# Patient Record
Sex: Male | Born: 1995 | Race: White | Hispanic: No | Marital: Single | Smoking: Current every day smoker
Health system: Southern US, Community
[De-identification: ages and names within clinical notes are randomized; demographics above are authoritative.]

## PROBLEM LIST (undated history)

## (undated) DIAGNOSIS — E119 Type 2 diabetes mellitus without complications: Secondary | ICD-10-CM

## (undated) DIAGNOSIS — I82409 Acute embolism and thrombosis of unspecified deep veins of unspecified lower extremity: Secondary | ICD-10-CM

---

## 2016-08-04 ENCOUNTER — Encounter: Payer: Self-pay | Admitting: Emergency Medicine

## 2016-08-04 ENCOUNTER — Inpatient Hospital Stay: Payer: BLUE CROSS/BLUE SHIELD

## 2016-08-04 ENCOUNTER — Emergency Department: Payer: BLUE CROSS/BLUE SHIELD

## 2016-08-04 ENCOUNTER — Inpatient Hospital Stay
Admission: EM | Admit: 2016-08-04 | Discharge: 2016-08-06 | DRG: 637 | Disposition: A | Discharge status: Home / Self Care | Payer: BLUE CROSS/BLUE SHIELD | Attending: Internal Medicine | Admitting: Internal Medicine

## 2016-08-04 DIAGNOSIS — N179 Acute kidney failure, unspecified: Secondary | ICD-10-CM | POA: Diagnosis present

## 2016-08-04 DIAGNOSIS — Z01818 Encounter for other preprocedural examination: Secondary | ICD-10-CM

## 2016-08-04 DIAGNOSIS — G9341 Metabolic encephalopathy: Secondary | ICD-10-CM | POA: Diagnosis present

## 2016-08-04 DIAGNOSIS — E1110 Type 2 diabetes mellitus with ketoacidosis without coma: Secondary | ICD-10-CM | POA: Diagnosis present

## 2016-08-04 DIAGNOSIS — Z9114 Patient's other noncompliance with medication regimen: Secondary | ICD-10-CM

## 2016-08-04 DIAGNOSIS — Z794 Long term (current) use of insulin: Secondary | ICD-10-CM

## 2016-08-04 DIAGNOSIS — E860 Dehydration: Secondary | ICD-10-CM | POA: Diagnosis present

## 2016-08-04 DIAGNOSIS — Z86718 Personal history of other venous thrombosis and embolism: Secondary | ICD-10-CM

## 2016-08-04 DIAGNOSIS — G9340 Encephalopathy, unspecified: Secondary | ICD-10-CM

## 2016-08-04 DIAGNOSIS — E871 Hypo-osmolality and hyponatremia: Secondary | ICD-10-CM | POA: Diagnosis present

## 2016-08-04 DIAGNOSIS — J9601 Acute respiratory failure with hypoxia: Secondary | ICD-10-CM | POA: Diagnosis not present

## 2016-08-04 DIAGNOSIS — E1010 Type 1 diabetes mellitus with ketoacidosis without coma: Principal | ICD-10-CM | POA: Diagnosis present

## 2016-08-04 DIAGNOSIS — F17210 Nicotine dependence, cigarettes, uncomplicated: Secondary | ICD-10-CM | POA: Diagnosis present

## 2016-08-04 DIAGNOSIS — E876 Hypokalemia: Secondary | ICD-10-CM | POA: Diagnosis present

## 2016-08-04 HISTORY — DX: Acute embolism and thrombosis of unspecified deep veins of unspecified lower extremity: I82.409

## 2016-08-04 HISTORY — DX: Type 2 diabetes mellitus without complications: E11.9

## 2016-08-04 LAB — URINALYSIS, COMPLETE (UACMP) WITH MICROSCOPIC
BACTERIA UA: NONE SEEN
BILIRUBIN URINE: NEGATIVE
HGB URINE DIPSTICK: NEGATIVE
Ketones, ur: 80 mg/dL — AB
LEUKOCYTES UA: NEGATIVE
NITRITE: NEGATIVE
PH: 5 (ref 5.0–8.0)
Protein, ur: 30 mg/dL — AB
RBC / HPF: NONE SEEN RBC/hpf (ref 0–5)
SPECIFIC GRAVITY, URINE: 1.015 (ref 1.005–1.030)
Squamous Epithelial / LPF: NONE SEEN
WBC, UA: NONE SEEN WBC/hpf (ref 0–5)

## 2016-08-04 LAB — LACTIC ACID, PLASMA: Lactic Acid, Venous: 3.1 mmol/L (ref 0.5–1.9)

## 2016-08-04 LAB — BASIC METABOLIC PANEL
ANION GAP: 21 — AB (ref 5–15)
ANION GAP: 15 (ref 5–15)
BUN: 16 mg/dL (ref 6–20)
BUN: 12 mg/dL (ref 6–20)
CALCIUM: 10.1 mg/dL (ref 8.9–10.3)
CHLORIDE: 101 mmol/L (ref 101–111)
CO2: 16 mmol/L — AB (ref 22–32)
CO2: 13 mmol/L — ABNORMAL LOW (ref 22–32)
Calcium: 8.4 mg/dL — ABNORMAL LOW (ref 8.9–10.3)
Chloride: 90 mmol/L — ABNORMAL LOW (ref 101–111)
Creatinine, Ser: 1.3 mg/dL — ABNORMAL HIGH (ref 0.61–1.24)
Creatinine, Ser: 1.02 mg/dL (ref 0.61–1.24)
GFR calc Af Amer: >60 mL/min (ref >60)
GFR calc Af Amer: >60 mL/min (ref >60)
GFR calc non Af Amer: >60 mL/min (ref >60)
GLUCOSE: 255 mg/dL — AB (ref 65–99)
Glucose, Bld: 347 mg/dL — ABNORMAL HIGH (ref 65–99)
POTASSIUM: 2.9 mmol/L — AB (ref 3.5–5.1)
Potassium: 4 mmol/L (ref 3.5–5.1)
Sodium: 127 mmol/L — ABNORMAL LOW (ref 135–145)
Sodium: 129 mmol/L — ABNORMAL LOW (ref 135–145)

## 2016-08-04 LAB — URINE DRUG SCREEN, QUALITATIVE (ARMC ONLY)
AMPHETAMINES, UR SCREEN: NOT DETECTED
Barbiturates, Ur Screen: NOT DETECTED
Benzodiazepine, Ur Scrn: NOT DETECTED
CANNABINOID 50 NG, UR ~~LOC~~: NOT DETECTED
COCAINE METABOLITE, UR ~~LOC~~: NOT DETECTED
MDMA (ECSTASY) UR SCREEN: NOT DETECTED
Methadone Scn, Ur: NOT DETECTED
Opiate, Ur Screen: NOT DETECTED
Phencyclidine (PCP) Ur S: NOT DETECTED
TRICYCLIC, UR SCREEN: NOT DETECTED

## 2016-08-04 LAB — CBC WITH DIFFERENTIAL/PLATELET
BASOS ABS: 0.1 10*3/uL (ref 0–0.1)
Basophils Relative: 1 %
Eosinophils Absolute: 0 10*3/uL (ref 0–0.7)
Eosinophils Relative: 0 %
HEMATOCRIT: 50.2 % (ref 40.0–52.0)
Hemoglobin: 17.2 g/dL (ref 13.0–18.0)
LYMPHS ABS: 1.4 10*3/uL (ref 1.0–3.6)
LYMPHS PCT: 13 %
MCH: 29.9 pg (ref 26.0–34.0)
MCHC: 34.3 g/dL (ref 32.0–36.0)
MCV: 87 fL (ref 80.0–100.0)
MONO ABS: 1.2 10*3/uL — AB (ref 0.2–1.0)
MONOS PCT: 11 %
NEUTROS ABS: 7.9 10*3/uL — AB (ref 1.4–6.5)
Neutrophils Relative %: 75 %
Platelets: 418 10*3/uL (ref 150–440)
RBC: 5.77 MIL/uL (ref 4.40–5.90)
RDW: 13.9 % (ref 11.5–14.5)
WBC: 10.6 10*3/uL (ref 3.8–10.6)

## 2016-08-04 LAB — BLOOD GAS, ARTERIAL
Acid-base deficit: 17 mmol/L — ABNORMAL HIGH (ref 0.0–2.0)
BICARBONATE: 11.4 mmol/L — AB (ref 20.0–28.0)
FIO2: 1
LHR: 26 {breaths}/min
MECHVT: 500 mL
PATIENT TEMPERATURE: 37
PEEP/CPAP: 8 cmH2O
PH ART: 7.12 — AB (ref 7.350–7.450)
pCO2 arterial: 35 mmHg (ref 32.0–48.0)
pO2, Arterial: >552 mmHg — ABNORMAL HIGH (ref 83.0–108.0)

## 2016-08-04 LAB — GLUCOSE, CAPILLARY
GLUCOSE-CAPILLARY: 320 mg/dL — AB (ref 65–99)
GLUCOSE-CAPILLARY: 350 mg/dL — AB (ref 65–99)
Glucose-Capillary: 357 mg/dL — ABNORMAL HIGH (ref 65–99)
Glucose-Capillary: 280 mg/dL — ABNORMAL HIGH (ref 65–99)
Glucose-Capillary: 156 mg/dL — ABNORMAL HIGH (ref 65–99)
Glucose-Capillary: 182 mg/dL — ABNORMAL HIGH (ref 65–99)
Glucose-Capillary: 189 mg/dL — ABNORMAL HIGH (ref 65–99)

## 2016-08-04 LAB — TRIGLYCERIDES: TRIGLYCERIDES: 318 mg/dL — AB (ref <150)

## 2016-08-04 LAB — AMMONIA: Ammonia: 36 umol/L — ABNORMAL HIGH (ref 9–35)

## 2016-08-04 LAB — MAGNESIUM: Magnesium: 1.6 mg/dL — ABNORMAL LOW (ref 1.7–2.4)

## 2016-08-04 LAB — SALICYLATE LEVEL: Salicylate Lvl: <7 mg/dL (ref 2.8–30.0)

## 2016-08-04 LAB — MRSA PCR SCREENING: MRSA by PCR: NEGATIVE

## 2016-08-04 LAB — TROPONIN I: Troponin I: <0.03 ng/mL (ref <0.03)

## 2016-08-04 LAB — ACETAMINOPHEN LEVEL: Acetaminophen (Tylenol), Serum: <10 ug/mL — ABNORMAL LOW (ref 10–30)

## 2016-08-04 LAB — BETA-HYDROXYBUTYRIC ACID: BETA-HYDROXYBUTYRIC ACID: 5.76 mmol/L — AB (ref 0.05–0.27)

## 2016-08-04 LAB — PHOSPHORUS: Phosphorus: 3.5 mg/dL (ref 2.5–4.6)

## 2016-08-04 MED ORDER — ACETAMINOPHEN 650 MG RE SUPP: 650.0000 mg | Freq: Four times a day (QID) | RECTAL | Status: DC | PRN

## 2016-08-04 MED ORDER — SODIUM CHLORIDE 0.9 % IV SOLN
30.0000 meq | Freq: Once | INTRAVENOUS | Status: AC
  Administered 2016-08-04: 30 meq via INTRAVENOUS
  Filled 2016-08-04: qty 15

## 2016-08-04 MED ORDER — SODIUM CHLORIDE 0.9 % IV BOLUS (SEPSIS)
1000.0000 mL | Freq: Once | INTRAVENOUS | Status: AC
Start: 2016-08-04 — End: 2016-08-04
  Administered 2016-08-04: 1000 mL via INTRAVENOUS

## 2016-08-04 MED ORDER — SODIUM CHLORIDE 0.9 % IV SOLN: INTRAVENOUS | Status: AC

## 2016-08-04 MED ORDER — FAMOTIDINE IN NACL 20-0.9 MG/50ML-% IV SOLN: 20.0000 mg | INTRAVENOUS | Status: DC

## 2016-08-04 MED ORDER — ONDANSETRON HCL 4 MG PO TABS: 4.0000 mg | ORAL_TABLET | Freq: Four times a day (QID) | ORAL | Status: DC | PRN

## 2016-08-04 MED ORDER — METOPROLOL TARTRATE 5 MG/5ML IV SOLN
INTRAVENOUS | Status: AC
  Administered 2016-08-04: 5 mg via INTRAVENOUS
  Filled 2016-08-04: qty 5

## 2016-08-04 MED ORDER — SODIUM CHLORIDE 0.9 % IV SOLN
INTRAVENOUS | Status: DC
  Administered 2016-08-04: 18:00:00 via INTRAVENOUS

## 2016-08-04 MED ORDER — KETOROLAC TROMETHAMINE 30 MG/ML IJ SOLN: 30.0000 mg | Freq: Four times a day (QID) | INTRAMUSCULAR | Status: DC | PRN

## 2016-08-04 MED ORDER — SODIUM CHLORIDE 0.9 % IV SOLN
INTRAVENOUS | Status: DC
  Administered 2016-08-04: 2.9 [IU]/h via INTRAVENOUS
  Filled 2016-08-04: qty 2.5

## 2016-08-04 MED ORDER — LIDOCAINE VISCOUS 2 % MT SOLN
15.0000 mL | Freq: Once | OROMUCOSAL | Status: AC
  Administered 2016-08-04: 15 mL via OROMUCOSAL
  Filled 2016-08-04: qty 15

## 2016-08-04 MED ORDER — FENTANYL CITRATE (PF) 100 MCG/2ML IJ SOLN
100.0000 ug | INTRAMUSCULAR | Status: DC | PRN
  Filled 2016-08-04: qty 2

## 2016-08-04 MED ORDER — PRO-STAT SUGAR FREE PO LIQD: 30.0000 mL | Freq: Two times a day (BID) | ORAL | Status: DC

## 2016-08-04 MED ORDER — HEPARIN SODIUM (PORCINE) 5000 UNIT/ML IJ SOLN
5000.0000 [IU] | Freq: Three times a day (TID) | INTRAMUSCULAR | Status: DC
  Administered 2016-08-04: 5000 [IU] via SUBCUTANEOUS
  Filled 2016-08-04: qty 1

## 2016-08-04 MED ORDER — FENTANYL CITRATE (PF) 100 MCG/2ML IJ SOLN
100.0000 ug | Freq: Once | INTRAMUSCULAR | Status: AC
  Administered 2016-08-04: 100 ug via INTRAVENOUS

## 2016-08-04 MED ORDER — METOPROLOL TARTRATE 5 MG/5ML IV SOLN
5.0000 mg | INTRAVENOUS | Status: AC
  Administered 2016-08-04: 5 mg via INTRAVENOUS

## 2016-08-04 MED ORDER — HYDROCODONE-ACETAMINOPHEN 5-325 MG PO TABS: 1.0000 | ORAL_TABLET | ORAL | Status: DC | PRN

## 2016-08-04 MED ORDER — VITAL HIGH PROTEIN PO LIQD: 1000.0000 mL | ORAL | Status: DC

## 2016-08-04 MED ORDER — ROCURONIUM BROMIDE 50 MG/5ML IV SOLN
50.0000 mg | Freq: Once | INTRAVENOUS | Status: AC
Start: 2016-08-04 — End: 2016-08-04
  Administered 2016-08-04: 50 mg via INTRAVENOUS

## 2016-08-04 MED ORDER — FENTANYL CITRATE (PF) 100 MCG/2ML IJ SOLN
100.0000 ug | INTRAMUSCULAR | Status: DC | PRN
  Administered 2016-08-04: 100 ug via INTRAVENOUS

## 2016-08-04 MED ORDER — DEXTROSE-NACL 5-0.45 % IV SOLN
INTRAVENOUS | Status: DC
  Administered 2016-08-04: 21:00:00 via INTRAVENOUS

## 2016-08-04 MED ORDER — ACETAMINOPHEN 325 MG PO TABS: 650.0000 mg | ORAL_TABLET | Freq: Four times a day (QID) | ORAL | Status: DC | PRN

## 2016-08-04 MED ORDER — ONDANSETRON HCL 4 MG/2ML IJ SOLN: 4.0000 mg | Freq: Four times a day (QID) | INTRAMUSCULAR | Status: DC | PRN

## 2016-08-04 MED ORDER — MIDAZOLAM HCL 2 MG/2ML IJ SOLN: 1.0000 mg | INTRAMUSCULAR | Status: DC | PRN

## 2016-08-04 MED ORDER — PROPOFOL 1000 MG/100ML IV EMUL
0.0000 ug/kg/min | INTRAVENOUS | Status: DC
Start: 2016-08-04 — End: 2016-08-04
  Administered 2016-08-04: 50 ug/kg/min via INTRAVENOUS
  Filled 2016-08-04: qty 100

## 2016-08-04 MED ORDER — SODIUM CHLORIDE 0.9 % IV BOLUS (SEPSIS)
1000.0000 mL | Freq: Once | INTRAVENOUS | Status: AC
  Administered 2016-08-04: 1000 mL via INTRAVENOUS

## 2016-08-04 MED ORDER — MIDAZOLAM HCL 2 MG/2ML IJ SOLN
2.0000 mg | Freq: Once | INTRAMUSCULAR | Status: AC
  Administered 2016-08-04: 2 mg via INTRAVENOUS

## 2016-08-04 MED ORDER — FENTANYL 2500MCG IN NS 250ML (10MCG/ML) PREMIX INFUSION: 25.0000 ug/h | INTRAVENOUS | Status: DC

## 2016-08-04 MED ORDER — ALBUTEROL SULFATE (2.5 MG/3ML) 0.083% IN NEBU: 2.5000 mg | INHALATION_SOLUTION | RESPIRATORY_TRACT | Status: DC | PRN

## 2016-08-04 MED ORDER — SODIUM CHLORIDE 0.9% FLUSH
3.0000 mL | Freq: Two times a day (BID) | INTRAVENOUS | Status: DC
  Administered 2016-08-04 – 2016-08-05 (×2): 3 mL via INTRAVENOUS

## 2016-08-04 NOTE — H&P (Signed)
[employer] [facility] [place] at [facility]   PATIENT NAME: Isaac Dean    MR#:  [id number]  DATE OF BIRTH:  11-17-1995  DATE OF ADMISSION:  08/04/2016  PRIMARY CARE PHYSICIAN: No PCP Per Patient   REQUESTING/REFERRING PHYSICIAN: Willy Eddy, MD  CHIEF COMPLAINT:   Chief Complaint  Patient presents with  . Possible DKA  . Emesis    HISTORY OF PRESENT ILLNESS:  Isaac Dean  is a 21 y.o. male with a known history of Diabetes 1, DKA and DVT. The patient presently ED with abdominal pain, nausea and vomiting for 2 days. He said he traveled from [place] and the run out of insulin. He has not used insulin for one week. He has malaise and generalized weakness but denies any fever or chills. He has hyperglycemia and AG is 21. PAST MEDICAL HISTORY:   Past Medical History:  Diagnosis Date  . Diabetes mellitus without complication (HCC)   . DVT (deep venous thrombosis) (HCC)     PAST SURGICAL HISTORY:  History reviewed. No pertinent surgical history. No surgical history.  SOCIAL HISTORY:   Social History  Substance Use Topics  . Smoking status: Current Every Day Smoker    Types: Cigarettes  . Smokeless tobacco: Never Used  . Alcohol use No    FAMILY HISTORY:  No family history on file. He denies any family history.  DRUG ALLERGIES:  No Known Allergies  REVIEW OF SYSTEMS:   Review of Systems  Constitutional: Positive for malaise/fatigue. Negative for chills and fever.  HENT: Negative for congestion.   Eyes: Negative for blurred vision and double vision.  Respiratory: Negative for cough, shortness of breath and stridor.   Cardiovascular: Negative for chest pain and leg swelling.  Gastrointestinal: Positive for abdominal pain, nausea and vomiting. Negative for blood in stool, diarrhea and melena.  Genitourinary: Negative for dysuria and hematuria.  Musculoskeletal: Negative for back pain.  Neurological: Positive for dizziness and weakness. Negative for  focal weakness, seizures, loss of consciousness and headaches.  Psychiatric/Behavioral: Negative for depression. The patient is not nervous/anxious.     MEDICATIONS AT HOME:   Prior to Admission medications   Medication Sig Start Date End Date Taking? Authorizing Provider  HUMALOG KWIKPEN 100 UNIT/ML KiwkPen Inject 30 Units into the skin 3 (three) times daily. 05/21/16  Yes Historical Provider, MD  LANTUS SOLOSTAR 100 UNIT/ML Solostar Pen Inject 30 Units into the skin 2 (two) times daily. 05/18/16  Yes Historical Provider, MD      VITAL SIGNS:  Blood pressure 135/88, pulse 98, temperature 98 F (36.7 C), temperature source Oral, resp. rate 14, height  (1.803 m), weight 152 lb 2 oz (69 kg), SpO2 100 %.  PHYSICAL EXAMINATION:  Physical Exam  GENERAL:  21 y.o.-year-old patient lying in the bed with no acute distress.  EYES: Pupils equal, round, reactive to light and accommodation. No scleral icterus. Extraocular muscles intact.  HEENT: Head atraumatic, normocephalic. Oropharynx and nasopharynx clear.  NECK:  Supple, no jugular venous distention. No thyroid enlargement, no tenderness.  LUNGS: Normal breath sounds bilaterally, no wheezing, rales,rhonchi or crepitation. No use of accessory muscles of respiration.  CARDIOVASCULAR: S1, S2 normal. No murmurs, rubs, or gallops.  ABDOMEN: Soft, Mild tenderness, nondistended. Bowel sounds present. No organomegaly or mass.  EXTREMITIES: No pedal edema, cyanosis, or clubbing.  NEUROLOGIC: Cranial nerves II through XII are intact. Muscle strength 5/5 in all extremities. Sensation intact. Gait not checked.  PSYCHIATRIC: The patient is alert and  oriented x 3.  SKIN: No obvious rash, lesion, or ulcer.   LABORATORY PANEL:   CBC  Recent Labs Lab 08/04/16 1442  WBC 10.6  HGB 17.2  HCT 50.2  PLT 418   ------------------------------------------------------------------------------------------------------------------  Chemistries   Recent  Labs Lab 08/04/16 1442  NA 127*  K 4.0  CL 90*  CO2 16*  GLUCOSE 347*  BUN 16  CREATININE 1.30*  CALCIUM 10.1   ------------------------------------------------------------------------------------------------------------------  Cardiac Enzymes No results for input(s): TROPONINI in the last 168 hours. ------------------------------------------------------------------------------------------------------------------  RADIOLOGY:  Dg Chest 2 View  Result Date: 08/04/2016 CLINICAL DATA:  Malaise, vomiting, for the odor to the breath, diabetic. EXAM: CHEST  2 VIEW COMPARISON:  None in PACs FINDINGS: The lungs are adequately inflated and clear. The heart and pulmonary vascularity are normal. The mediastinum is normal in width. There is no pleural effusion. IMPRESSION: There is no pneumonia nor other acute cardiopulmonary abnormality. Electronically Signed   By: David  [place]iland M.D.   On: 08/04/2016 15:51      IMPRESSION AND PLAN:   DKA The patient will be admitted to stepdown unit. Continue insulin drip, follow-up BMP every 4 hours, follow DKA protocol. IV fluid support. Zofran when necessary and pain control.  Acute renal failure. Continue IV fluid support and follow-up BMP.  Hyponatremia. Possible due to hyperglycemia. Follow-up BMP.  Tobacco abuse. Smoking cessation was counseled for 4 minutes, nicotine patch. All the records are reviewed and case discussed with ED provider. Management plans discussed with the patient, family and they are in agreement.  CODE STATUS: Full code  TOTAL CRITICAL TIME TAKING CARE OF THIS PATIENT: 55 minutes.    Shaune Pollack M.D on 08/04/2016 at 6:50 PM  Between 7am to 6pm - Pager - [phone]  After 6pm go to [email or web address] - Scientist, research (life sciences) [facility]  Office  [phone]  CC: Primary care physician; No PCP Per Patient   Note: This dictation was prepared with Dragon dictation along with smaller phrase  technology. Any transcriptional errors that result from this process are unintentional.

## 2016-08-04 NOTE — Consult Note (Signed)
PULMONARY / CRITICAL CARE MEDICINE   Name: Isaac Dean MRN: [id number] DOB: 07-Nov-1995    ADMISSION DATE:  08/04/2016 CONSULTATION DATE: 08/04/2016  REFERRING MD: Dr. Imogene Burn   CHIEF COMPLAINT: Possible DKA and Emesis   HISTORY OF PRESENT ILLNESS:   This is a 21 yo male with a PMH of DVT and Insulin dependent diabetes mellitus.  He presented to [facility] ER 04/12 with c/o worsening nausea, vomiting, and diffuse abdominal pain onset 1 week prior to presentation to the ER.  Per ER notes the pt has been steadily having an increase in CBG readings despite taking insulin as prescribed, however the pt thinks his insulin is expired.  He has a hx of multiple admissions due to DKA.  In the ER lab results revealed serum glucose 347, anion gap 21, and venous pH 7.28, CO2 33 meeting criteria for DKA.  He was started on insulin drip and admitted to ICU.  However, upon arrival to ICU he stated he could not move his legs and stated "I cannot talk" then he became apneic, cyanotic, O2 sats 50% requiring emergent intubation.    PAST MEDICAL HISTORY :  He  has a past medical history of Diabetes mellitus without complication (HCC) and DVT (deep venous thrombosis) (HCC).  PAST SURGICAL HISTORY: He  has no past surgical history on file.  No Known Allergies  No current facility-administered medications on file prior to encounter.    No current outpatient prescriptions on file prior to encounter.    FAMILY HISTORY:  His has no family status information on file.    SOCIAL HISTORY: He  reports that he has been smoking Cigarettes.  He has never used smokeless tobacco. He reports that he does not drink alcohol.  REVIEW OF SYSTEMS:   Unable to assess pt currently intubated   SUBJECTIVE:  Pt became apneic and cyanotic requiring mechanical intubation  VITAL SIGNS: BP (!) 163/95   Pulse (!) 152   Temp 98 F (36.7 C) (Oral)   Resp 14   Ht  (1.803 m)   Wt 69 kg (152 lb 2 oz)   SpO2 99%   BMI  21.22 kg/m   HEMODYNAMICS:    VENTILATOR SETTINGS:    INTAKE / OUTPUT: I/O last 3 completed shifts: In: 3000 [IV Piggyback:3000] Out: -   PHYSICAL EXAMINATION: General: acutely ill Caucasian male  Neuro: sedated, not following commands, PERRL HEENT: supple, no JVD Cardiovascular: sinus tach, s1s2, no M/R/G Lungs: rhonchi throughout, even, non labored, mechanically intubated  Abdomen: +BS x4, soft, non tender, non distended  Musculoskeletal: normal bulk and tone, no edema  Skin: intact no rashes or lesions   LABS:  BMET  Recent Labs Lab 08/04/16 1442  NA 127*  K 4.0  CL 90*  CO2 16*  BUN 16  CREATININE 1.30*  GLUCOSE 347*    Electrolytes  Recent Labs Lab 08/04/16 1442  CALCIUM 10.1    CBC  Recent Labs Lab 08/04/16 1442  WBC 10.6  HGB 17.2  HCT 50.2  PLT 418    Coag's No results for input(s): APTT, INR in the last 168 hours.  Sepsis Markers No results for input(s): LATICACIDVEN, PROCALCITON, O2SATVEN in the last 168 hours.  ABG No results for input(s): PHART, PCO2ART, PO2ART in the last 168 hours.  Liver Enzymes No results for input(s): AST, ALT, ALKPHOS, BILITOT, ALBUMIN in the last 168 hours.  Cardiac Enzymes No results for input(s): TROPONINI, PROBNP in the last 168 hours.  Glucose  Recent Labs Lab 08/04/16 1430 08/04/16 1632 08/04/16 1818  GLUCAP 320* 350* 357*    Imaging Dg Chest 2 View  Result Date: 08/04/2016 CLINICAL DATA:  Malaise, vomiting, for the odor to the breath, diabetic. EXAM: CHEST  2 VIEW COMPARISON:  None in PACs FINDINGS: The lungs are adequately inflated and clear. The heart and pulmonary vascularity are normal. The mediastinum is normal in width. There is no pleural effusion. IMPRESSION: There is no pneumonia nor other acute cardiopulmonary abnormality. Electronically Signed   By: David  [place]iland M.D.   On: 08/04/2016 15:51   STUDIES:  CT Head 04/12>>  CULTURES: None   ANTIBIOTICS: None   SIGNIFICANT  EVENTS: 04/12-Pt admitted to ICU with DKA became apneic requiring mechanical intubation PCCM consulted for management  LINES/TUBES: ETT 04/12>>  ASSESSMENT / PLAN:  PULMONARY A: Acute hypoxic respiratory failure  Mechanical Intubation P:   Full vent support wean as tolerated  VAP Bundle Prn bronchodilator therapy  Repeat CXR  Obtain ABG  CARDIOVASCULAR A:  Sinus tachycardia  P:  Trend troponin's Continuous telemetry monitoring   RENAL A:   Acute renal failure  Hyponatremia likely pseudo secondary to DKA  Metabolic Acidosis  P:   Trend BMP q4hrs while on insulin gtt then daily Replace electrolytes as indicated  Monitor UOP Avoid nephrotoxic medications  NS @ 150 ml/hr then transition D5NS once cbg <250 mg/dl  GASTROINTESTINAL A:   No acute issues  P:   Pepcid for PUD prophylaxis  Initiate tube feeds   HEMATOLOGIC A:   No acute issues  P:  Trend CBC's Subq heparin for VTE prophylaxis  Monitor for s/sx of bleeding   INFECTIOUS A:   No acute issues  P:   Trend WBC and monitor fever curve If pt develops leukocytosis and becomes febrile will order PCT   ENDOCRINE A:   Diabetic Ketoacidosis  Hx: Insulin dependent type I DM  P:   Continue insulin gtt until anion gap <12 and serum CO2 >20, then will transition to phase 2 DKA order set  When glucose level falls <250 mg/dl will change iv fluids to d51/2 NS to prevent hypoglycemia  CBG's q1hr while pt remains on insulin gtt  NEUROLOGIC A:   Metabolic Encephalopathy ?Seizure Activity  P:   RASS goal: 0 to -1 Propofol gtt and prn fentanyl to maintain RASS goal  Obtain EEG Urine drug screen pending Salicylate and Acetaminophen level pending  WUA daily    FAMILY  - Updates: No family at bedside to update 08/04/2016  - Inter-disciplinary family meet or Palliative Care meeting due by:  08/11/2016   Sonda Rumble, Juel Burrow  Pulmonary/Critical Care Pager [phone] (please enter 7 digits) PCCM  Consult Pager [phone] (please enter 7 digits)

## 2016-08-04 NOTE — Progress Notes (Signed)
Advanced tube to 24cm at the lip, Per Annabelle Harman NP request

## 2016-08-04 NOTE — Progress Notes (Signed)
Decreased fio2 to 40%, PaO2 was >552 on ABG. Annabelle Harman NP aware.

## 2016-08-04 NOTE — Progress Notes (Signed)
Extubated per Bincy V. NP to 3l [place]., sats 100%. Will continue to monitor.

## 2016-08-04 NOTE — ED Provider Notes (Signed)
[facility] Emergency Department Provider Note    None    (approximate)  I have reviewed the triage vital signs and the nursing notes.   HISTORY  Chief Complaint Possible DKA and Emesis    HPI Isaac Dean is a 21 y.o. male with a history of insulin-dependent diabetes presents with 1 week of worsening nausea and vomiting and diffuse abdominal pain. Patient states his sugar has been steadily increasing but is been taking his insulin. Patient states he thinks that the insulin is expired. No fevers. Is complaining of sore throat from vomiting. Has had multiple visits for DKA in the past.  Denies any chest pain. No rash. No diarrhea.   Past Medical History:  Diagnosis Date  . Diabetes mellitus without complication (HCC)   . DVT (deep venous thrombosis) (HCC)    No family history of bleeding disorders History reviewed. No pertinent surgical history. There are no active problems to display for this patient.     Prior to Admission medications   Not on File    Allergies Patient has no known allergies.    Social History Social History  Substance Use Topics  . Smoking status: Current Every Day Smoker    Types: Cigarettes  . Smokeless tobacco: Never Used  . Alcohol use No    Review of Systems Patient denies headaches, rhinorrhea, blurry vision, numbness, shortness of breath, chest pain, edema, cough, abdominal pain, nausea, vomiting, diarrhea, dysuria, fevers, rashes or hallucinations unless otherwise stated above in HPI. ____________________________________________   PHYSICAL EXAM:  VITAL SIGNS: Vitals:   08/04/16 1435  BP: (!) 125/98  Pulse: (!) 117  Resp: 18  Temp: 98 F (36.7 C)    Constitutional: Alert and oriented.  Ill appearing but in no acute distress. Eyes: Conjunctivae are normal. PERRL. EOMI. Head: Atraumatic. Nose: No congestion/rhinnorhea. Mouth/Throat: Mucous membranes are moist.  Oropharynx  non-erythematous. Neck: No stridor. Painless ROM. No cervical spine tenderness to palpation Hematological/Lymphatic/Immunilogical: No cervical lymphadenopathy. Cardiovascular: tachycardic rate, regular rhythm. Grossly normal heart sounds.  Good peripheral circulation. Respiratory: Normal respiratory effort.  No retractions. Lungs CTAB. Gastrointestinal: Soft and nontender. No distention. No abdominal bruits. No CVA tenderness. Musculoskeletal: No lower extremity tenderness nor edema.  No joint effusions. Neurologic:  Normal speech and language. No gross focal neurologic deficits are appreciated. No gait instability. Skin:  Skin is warm, dry and intact. No rash noted. Psychiatric: Mood and affect are normal. Speech and behavior are normal.  ____________________________________________   LABS (all labs ordered are listed, but only abnormal results are displayed)  Results for orders placed or performed during the hospital encounter of 08/04/16 (from the past 24 hour(s))  Glucose, capillary     Status: Abnormal   Collection Time: 08/04/16  2:30 PM  Result Value Ref Range   Glucose-Capillary 320 (H) 65 - 99 mg/dL   ____________________________________________  EKG My review and personal interpretation at Time:  14:39  Indication: nausea  Rate: 117  Rhythm: sinus Axis: normal Other: wpw, no st elevations ____________________________________________  RADIOLOGY  I personally reviewed all radiographic images ordered to evaluate for the above acute complaints and reviewed radiology reports and findings.  These findings were personally discussed with the patient.  Please see medical record for radiology report.  ____________________________________________   PROCEDURES  Procedure(s) performed:  Procedures    Critical Care performed:yes CRITICAL CARE Performed by: Willy Eddy   Total critical care time: 35 minutes  Critical care time was exclusive of separately billable  procedures and treating other patients.  Critical care was necessary to treat or prevent imminent or life-threatening deterioration.  Critical care was time spent personally by me on the following activities: development of treatment plan with patient and/or surrogate as well as nursing, discussions with consultants, evaluation of patient's response to treatment, examination of patient, obtaining history from patient or surrogate, ordering and performing treatments and interventions, ordering and review of laboratory studies, ordering and review of radiographic studies, pulse oximetry and re-evaluation of patient's condition. ____________________________________________   INITIAL IMPRESSION / ASSESSMENT AND PLAN / ED COURSE  Pertinent labs & imaging results that were available during my care of the patient were reviewed by me and considered in my medical decision making (see chart for details).  DDX: dka, hhns, electrolyte ab, dehydration,   Isaac Dean is a 21 y.o. who presents to the ED with complaint of nausea and elevated blood sugar. Patient is afebrile but tachycardic and ill-appearing. Does have ketones on his breath. Clinically consistent with DKA. His abdominal exam is soft and benign. We'll provide IV fluids and check labs.  The patient will be placed on continuous pulse oximetry and telemetry for monitoring.  Laboratory evaluation will be sent to evaluate for the above complaints.     Clinical Course as of Aug 05 1634  Thu Aug 04, 2016  1609 Patient with evidence of high anion gap metabolic acidosis consistent with DKA.  Will start insulin drip. Hemodynamics improving. Will continue IV fluids.  [PR]  1635 Patient refusing to provide urine. Based on his high anion gap I do suspect the patient has DKA and LAD on lab beta hydroxybutyric acid. Patient will require admission for IV fluids as well as insulin drip.  [PR]    Clinical Course User Index [PR] Willy Eddy, MD      ____________________________________________   FINAL CLINICAL IMPRESSION(S) / ED DIAGNOSES  Final diagnoses:  Type 1 diabetes mellitus with ketoacidosis without coma (HCC)      NEW MEDICATIONS STARTED DURING THIS VISIT:  New Prescriptions   No medications on file     Note:  This document was prepared using Dragon voice recognition software and may include unintentional dictation errors.    Willy Eddy, MD 08/04/16 [phone]

## 2016-08-04 NOTE — Progress Notes (Addendum)
Patient alert and oriented x4 on arrival to unit with no complaints. At approximately 1840 patient stated that he was having trouble breathing and could not move extremities. Grips equal, but weak. Pupils equal and brisk. VSS. Annabelle Harman, NP called to assess patient. After assessment patient began to flail arms and legs, HR increased to 130's/140's and oxygen saturations dropped to 88%. In process of placing patient on non rebreather saturations continued to drop. Patient bagged. Annabelle Harman, NP present in room, patient intubated.  Trudee Kuster

## 2016-08-04 NOTE — Progress Notes (Signed)
eLink Physician-Brief Progress Note Patient Name: Isaac Dean DOB: Jan 04, 1996 MRN: [id number]   Date of Service  08/04/2016  HPI/Events of Note  Patient with acute respiratory failure and altered mentation. Nurse practitioner reports patient was "foaming at the mouth". Post intubation patient has excellent lung compliance. Multiple potential etiologies.   eICU Interventions  1. Checking post intubation ABG & portable chest x-ray 2. Stat CT head without contrast 3. UDS stat 4. Repeat electrolyte panel now 5. Checking EEG      Intervention Category Major Interventions: Change in mental status - evaluation and management;Respiratory failure - evaluation and management  Lawanda Cousins 08/04/2016, 7:13 PM

## 2016-08-04 NOTE — ED Triage Notes (Signed)
Pt reports vomiting for eight days. Pt reports is insulin dependent diabetic. Pt states last used insulin this morning but states it is expired. Pt answers questions with vague responses. Pt states he doesn't really live anywhere. Pt with noted fruity odor to breath.

## 2016-08-04 NOTE — ED Notes (Signed)
Patient left for xray.

## 2016-08-04 NOTE — Progress Notes (Signed)
eLink Physician-Brief Progress Note Patient Name: Abad Manard DOB: 1995-09-30 MRN: [id number]   Date of Service  08/04/2016  HPI/Events of Note  Patient admitted with hyponatremia and DKA. Admitted to hospitalist service. Evaluated by intensivist physician extender.   eICU Interventions  Continuing plan of care as per recommendations.      Intervention Category Evaluation Type: New Patient Evaluation  Lawanda Cousins 08/04/2016, 6:31 PM

## 2016-08-04 NOTE — Progress Notes (Signed)
  Extubated the patient  pm as the patient is fully alert and oriented. Able to follow commands.Is able to protect his airway.   Isaac Dean,AG-ACNP Pulmonary & Critical Care

## 2016-08-04 NOTE — ED Notes (Signed)
Patient is alert and oriented x4. Patient c/o nausea, but did request something to drink. NS infusing without difficulty. Fruity odor noted on breath.

## 2016-08-04 NOTE — Procedures (Signed)
Endotracheal Intubation Procedure Note  Indication for endotracheal intubation: respiratory failure. Airway Assessment: Mallampati Class: I (soft palate, uvula, fauces, and tonsillar pillars visible). Sedation: fentanyl and midazolam. Paralytic: rocuronium. Lidocaine: no. Atropine: no. Equipment: glidescope utilized . Cricoid Pressure: no. Number of attempts: 1. ETT location confirmed by by auscultation, by CXR and ETCO2 monitor.  Sonda Rumble, AGNP  Pulmonary/Critical Care Pager [phone] (please enter 7 digits) PCCM Consult Pager [phone] (please enter 7 digits)  Billy Fischer, MD PCCM service Mobile [phone] Pager [phone] 08/08/2016

## 2016-08-04 NOTE — ED Notes (Signed)
Patient is unable to void and refuses a cath. MD aware.

## 2016-08-05 DIAGNOSIS — E1010 Type 1 diabetes mellitus with ketoacidosis without coma: Principal | ICD-10-CM

## 2016-08-05 LAB — CBC
HEMATOCRIT: 39.9 % — AB (ref 40.0–52.0)
Hemoglobin: 13.9 g/dL (ref 13.0–18.0)
MCH: 30.1 pg (ref 26.0–34.0)
MCHC: 34.8 g/dL (ref 32.0–36.0)
MCV: 86.7 fL (ref 80.0–100.0)
PLATELETS: 299 10*3/uL (ref 150–440)
RBC: 4.6 MIL/uL (ref 4.40–5.90)
RDW: 14 % (ref 11.5–14.5)
WBC: 8 10*3/uL (ref 3.8–10.6)

## 2016-08-05 LAB — MAGNESIUM
MAGNESIUM: 1.8 mg/dL (ref 1.7–2.4)
Magnesium: 1.9 mg/dL (ref 1.7–2.4)

## 2016-08-05 LAB — BASIC METABOLIC PANEL
ANION GAP: 8 (ref 5–15)
Anion gap: 9 (ref 5–15)
Anion gap: 9 (ref 5–15)
BUN: 10 mg/dL (ref 6–20)
BUN: 10 mg/dL (ref 6–20)
BUN: 11 mg/dL (ref 6–20)
CALCIUM: 8.8 mg/dL — AB (ref 8.9–10.3)
CALCIUM: 8.3 mg/dL — AB (ref 8.9–10.3)
CHLORIDE: 102 mmol/L (ref 101–111)
CO2: 20 mmol/L — AB (ref 22–32)
CO2: 21 mmol/L — AB (ref 22–32)
CO2: 21 mmol/L — AB (ref 22–32)
CREATININE: 0.82 mg/dL (ref 0.61–1.24)
CREATININE: 0.83 mg/dL (ref 0.61–1.24)
Calcium: 8.9 mg/dL (ref 8.9–10.3)
Chloride: 103 mmol/L (ref 101–111)
Chloride: 99 mmol/L — ABNORMAL LOW (ref 101–111)
Creatinine, Ser: 0.75 mg/dL (ref 0.61–1.24)
GFR calc Af Amer: >60 mL/min (ref >60)
GFR calc Af Amer: >60 mL/min (ref >60)
GFR calc non Af Amer: >60 mL/min (ref >60)
GFR calc non Af Amer: >60 mL/min (ref >60)
GLUCOSE: 148 mg/dL — AB (ref 65–99)
Glucose, Bld: 177 mg/dL — ABNORMAL HIGH (ref 65–99)
Glucose, Bld: 340 mg/dL — ABNORMAL HIGH (ref 65–99)
Potassium: 3.2 mmol/L — ABNORMAL LOW (ref 3.5–5.1)
Potassium: 3.3 mmol/L — ABNORMAL LOW (ref 3.5–5.1)
Potassium: 3.6 mmol/L (ref 3.5–5.1)
Sodium: 129 mmol/L — ABNORMAL LOW (ref 135–145)
Sodium: 131 mmol/L — ABNORMAL LOW (ref 135–145)
Sodium: 132 mmol/L — ABNORMAL LOW (ref 135–145)

## 2016-08-05 LAB — PHOSPHORUS
Phosphorus: 2.3 mg/dL — ABNORMAL LOW (ref 2.5–4.6)
Phosphorus: 1.2 mg/dL — ABNORMAL LOW (ref 2.5–4.6)

## 2016-08-05 LAB — GLUCOSE, CAPILLARY
GLUCOSE-CAPILLARY: 138 mg/dL — AB (ref 65–99)
GLUCOSE-CAPILLARY: 110 mg/dL — AB (ref 65–99)
GLUCOSE-CAPILLARY: 350 mg/dL — AB (ref 65–99)
GLUCOSE-CAPILLARY: 330 mg/dL — AB (ref 65–99)
GLUCOSE-CAPILLARY: 266 mg/dL — AB (ref 65–99)
Glucose-Capillary: 124 mg/dL — ABNORMAL HIGH (ref 65–99)
Glucose-Capillary: 133 mg/dL — ABNORMAL HIGH (ref 65–99)
Glucose-Capillary: 134 mg/dL — ABNORMAL HIGH (ref 65–99)
Glucose-Capillary: 141 mg/dL — ABNORMAL HIGH (ref 65–99)
Glucose-Capillary: 246 mg/dL — ABNORMAL HIGH (ref 65–99)

## 2016-08-05 MED ORDER — INSULIN ASPART 100 UNIT/ML ~~LOC~~ SOLN
0.0000 [IU] | Freq: Three times a day (TID) | SUBCUTANEOUS | Status: DC
  Administered 2016-08-05: 5 [IU] via SUBCUTANEOUS
  Administered 2016-08-05: 7 [IU] via SUBCUTANEOUS
  Administered 2016-08-06: 1 [IU] via SUBCUTANEOUS
  Filled 2016-08-05: qty 5
  Filled 2016-08-05: qty 1
  Filled 2016-08-05: qty 7

## 2016-08-05 MED ORDER — INSULIN GLARGINE 100 UNIT/ML ~~LOC~~ SOLN
30.0000 [IU] | Freq: Two times a day (BID) | SUBCUTANEOUS | Status: DC
  Administered 2016-08-05 – 2016-08-06 (×4): 30 [IU] via SUBCUTANEOUS
  Filled 2016-08-05 (×6): qty 0.3

## 2016-08-05 MED ORDER — INSULIN ASPART 100 UNIT/ML ~~LOC~~ SOLN
0.0000 [IU] | Freq: Every day | SUBCUTANEOUS | Status: DC
Start: 2016-08-05 — End: 2016-08-06
  Administered 2016-08-05: 2 [IU] via SUBCUTANEOUS
  Filled 2016-08-05: qty 2

## 2016-08-05 MED ORDER — INSULIN ASPART 100 UNIT/ML ~~LOC~~ SOLN
5.0000 [IU] | Freq: Three times a day (TID) | SUBCUTANEOUS | Status: DC
  Administered 2016-08-05 – 2016-08-06 (×3): 5 [IU] via SUBCUTANEOUS
  Filled 2016-08-05 (×3): qty 5

## 2016-08-05 MED ORDER — INSULIN ASPART 100 UNIT/ML ~~LOC~~ SOLN
0.0000 [IU] | Freq: Three times a day (TID) | SUBCUTANEOUS | Status: DC
  Administered 2016-08-05: 11 [IU] via SUBCUTANEOUS
  Filled 2016-08-05: qty 11

## 2016-08-05 MED ORDER — ENOXAPARIN SODIUM 40 MG/0.4ML ~~LOC~~ SOLN
40.0000 mg | SUBCUTANEOUS | Status: DC
  Administered 2016-08-05: 40 mg via SUBCUTANEOUS
  Filled 2016-08-05: qty 0.4

## 2016-08-05 MED ORDER — K PHOS MONO-SOD PHOS DI & MONO 155-852-130 MG PO TABS
500.0000 mg | ORAL_TABLET | Freq: Four times a day (QID) | ORAL | Status: AC
  Administered 2016-08-05 – 2016-08-06 (×2): 500 mg via ORAL
  Filled 2016-08-05 (×2): qty 2

## 2016-08-05 MED ORDER — POTASSIUM CHLORIDE CRYS ER 20 MEQ PO TBCR
40.0000 meq | EXTENDED_RELEASE_TABLET | Freq: Two times a day (BID) | ORAL | Status: AC
  Administered 2016-08-05 (×2): 40 meq via ORAL
  Filled 2016-08-05 (×2): qty 2

## 2016-08-05 MED ORDER — SODIUM CHLORIDE 0.9 % IV SOLN
INTRAVENOUS | Status: DC
  Administered 2016-08-05 – 2016-08-06 (×2): via INTRAVENOUS

## 2016-08-05 NOTE — Progress Notes (Signed)
Admission: patient alert and oriented. No pain. No skin issues. Patient aware of hyperglycemia and treatment. RN supervised patient while patient administered his own insulin. Patient seemed very educated on how to administer insulin. RN educated patient on different sites the patient can try to make administration easier for him.   Harvie Heck, RN

## 2016-08-05 NOTE — Progress Notes (Signed)
  Patient can be transferred to the floor.  Case was discussed with Dr. Tobi Bastos.   Erynne Kealey,AG-ACNP Pulmonary & Critical Care

## 2016-08-05 NOTE — Care Management (Signed)
Spoke with patient by phone after seeing that he does not have a PCP. He states he is traveling with a friend and plans to go back to [place] in two weeks. He states he "plans to get a new provider in [place]" so I'm assuming he has a provider in [place]. I explained the necessity to establish asap because it may take months to be seen by a new provider and that is how his medications will be re-filled. He agreed. He has a glucometer that works. He uses Insulin pens in the outpatient setting; he was diagnosed (per him) with diabetes 15 months ago. Has health insurance.

## 2016-08-05 NOTE — Progress Notes (Signed)
Inpatient Diabetes Program Recommendations  AACE/ADA: New Consensus Statement on Inpatient Glycemic Control (2015)  Target Ranges:  Prepandial:   less than 140 mg/dL      Peak postprandial:   less than 180 mg/dL (1-2 hours)      Critically ill patients:  140 - 180 mg/dL   Lab Results  Component Value Date   GLUCAP 110 (H) 08/05/2016    Review of Glycemic Control  Results for ELAI, VANWYK (MRN [id number]) as of 08/05/2016 08:00  Ref. Range 08/05/2016 01:12 08/05/2016 02:13 08/05/2016 03:16 08/05/2016 04:03 08/05/2016 07:48  Glucose-Capillary Latest Ref Range: 65 - 99 mg/dL 409 (H) 811 (H) 914 (H) 110 (H) 350 (H)    Diabetes history: Type 1 Outpatient Diabetes medications: Lantus 30 units bid, Humalog 30 units tid  Current orders for Inpatient glycemic control: Lantus 30 units bid, Novolog 0-15 units tid  Inpatient Diabetes Program Recommendations:  Since patient has Type 1 diabetes he needs basal (Lantus), mealtime (set dose of Novolog) and correction insulin (sliding scale Novolog insulin)  Please d/c Novolog 0-15 units tid and order Novolog 0-9 units tid, Novolog 0-5 units qhs and Novolog 5 units tid with meals (hold if patient eats less than 50%)  Susette Racer, RN, BA, [place], CDE Diabetes Coordinator Inpatient Diabetes Program  [phone] (Team Pager) [phone] [facility] Office) 08/05/2016 7:10 AM

## 2016-08-05 NOTE — Progress Notes (Signed)
Informed by night nurse that patient had not urinated since foley was removed around 10pm.  Upon Bladder scan, patient found to have over 600 in his bladder.  Assisted patient to standing position, and he was able to urinate clear yellow urine without difficulty.

## 2016-08-05 NOTE — Progress Notes (Addendum)
[facility] - [place] at [facility]   PATIENT NAME: Isaac Dean    MR#:  [id number]  DATE OF BIRTH:  December 21, 1995  SUBJECTIVE:  Isaac COMPLAINT:   Isaac Complaint  Patient presents with  . Possible DKA  . Emesis   -Patient from [place]. Ran out of insulin while visiting family here. Admitted with DKA. -Anion gap is closed, sugars are still elevated. Adjusting his Lantus. -Denies any complaints at this time  REVIEW OF SYSTEMS:  Review of Systems  Constitutional: Negative for chills, fever and malaise/fatigue.  HENT: Positive for sore throat. Negative for ear pain and hearing loss.   Eyes: Negative for blurred vision and double vision.  Respiratory: Negative for cough, shortness of breath and wheezing.   Cardiovascular: Negative for chest pain, palpitations and leg swelling.  Gastrointestinal: Positive for nausea. Negative for abdominal pain, constipation, diarrhea and vomiting.  Genitourinary: Negative for dysuria.  Musculoskeletal: Negative for back pain, myalgias and neck pain.  Neurological: Negative for dizziness, sensory change, speech change, focal weakness, seizures and headaches.  Psychiatric/Behavioral: Negative for depression.    DRUG ALLERGIES:  No Known Allergies  VITALS:  Blood pressure 120/65, pulse 92, temperature 97.9 F (36.6 C), temperature source Oral, resp. rate 15, height  (1.803 m), weight 69 kg (152 lb 2 oz), SpO2 96 %.  PHYSICAL EXAMINATION:  Physical Exam  GENERAL:  21 y.o.-year-old young patient lying in the bed with no acute distress.  EYES: Pupils equal, round, reactive to light and accommodation. No scleral icterus. Extraocular muscles intact.  HEENT: Head atraumatic, normocephalic. Oropharynx and nasopharynx clear.  NECK:  Supple, no jugular venous distention. No thyroid enlargement, no tenderness.  LUNGS: Normal breath sounds bilaterally, no wheezing, rales,rhonchi or crepitation. No use of accessory muscles of  respiration.  CARDIOVASCULAR: S1, S2 normal. No murmurs, rubs, or gallops.  ABDOMEN: Soft, nontender, nondistended. Bowel sounds present. No organomegaly or mass.  EXTREMITIES: No pedal edema, cyanosis, or clubbing.  NEUROLOGIC: Cranial nerves II through XII are intact. Muscle strength 5/5 in all extremities. Sensation intact. Gait not checked.  PSYCHIATRIC: The patient is alert and oriented x 3.  SKIN: No obvious rash, lesion, or ulcer.    LABORATORY PANEL:   CBC  Recent Labs Lab 08/05/16 0352  WBC 8.0  HGB 13.9  HCT 39.9*  PLT 299   ------------------------------------------------------------------------------------------------------------------  Chemistries   Recent Labs Lab 08/05/16 0352 08/05/16 0754  NA 132* 129*  K 3.2* 3.6  CL 103 99*  CO2 21* 21*  GLUCOSE 177* 340*  BUN 10 10  CREATININE 0.75 0.82  CALCIUM 8.8* 8.3*  MG 1.9  --    ------------------------------------------------------------------------------------------------------------------  Cardiac Enzymes  Recent Labs Lab 08/04/16 1916  TROPONINI <0.03   ------------------------------------------------------------------------------------------------------------------  RADIOLOGY:  Dg Chest 2 View  Result Date: 08/04/2016 CLINICAL DATA:  Malaise, vomiting, for the odor to the breath, diabetic. EXAM: CHEST  2 VIEW COMPARISON:  None in PACs FINDINGS: The lungs are adequately inflated and clear. The heart and pulmonary vascularity are normal. The mediastinum is normal in width. There is no pleural effusion. IMPRESSION: There is no pneumonia nor other acute cardiopulmonary abnormality. Electronically Signed   By: David  Swaziland M.D.   On: 08/04/2016 15:51   Ct Head Wo Contrast  Result Date: 08/04/2016 CLINICAL DATA:  Dizziness and weakness.  Altered mentation. EXAM: CT HEAD WITHOUT CONTRAST TECHNIQUE: Contiguous axial images were obtained from the base of the skull through the vertex without intravenous  contrast.  COMPARISON:  None. FINDINGS: BRAIN: The ventricles and sulci are normal. No intraparenchymal hemorrhage, mass effect nor midline shift. No acute large vascular territory infarcts. Grey-white matter distinction is maintained. The basal ganglia are unremarkable. No abnormal extra-axial fluid collections. Basal cisterns are not effaced and midline. The brainstem and cerebellar hemispheres are without acute abnormalities. VASCULAR: Unremarkable. SKULL/SOFT TISSUES: No skull fracture. No significant soft tissue swelling. ORBITS/SINUSES: The included ocular globes and orbital contents are normal.The mastoid air cells are clear. The included paranasal sinuses are well-aerated. OTHER: None. IMPRESSION: No acute intracranial abnormality. Electronically Signed   By: Tollie Eth M.D.   On: 08/04/2016 20:42   Dg Chest Port 1 View  Result Date: 08/04/2016 CLINICAL DATA:  Ventilator dependence. EXAM: PORTABLE CHEST 1 VIEW COMPARISON:  08/04/2016, earlier the same day. FINDINGS: 1902 hours. Endotracheal tube tip is 4.7 cm above the base the carina. Lungs are clear bilaterally. The cardiopericardial silhouette is within normal limits for size. The visualized bony structures of the thorax are intact. Telemetry leads overlie the chest. IMPRESSION: Endotracheal tube tip 4.7 cm above the base of the carina. No acute cardiopulmonary findings. Electronically Signed   By: Kennith Center M.D.   On: 08/04/2016 19:32    EKG:   Orders placed or performed during the hospital encounter of 08/04/16  . ED EKG  . ED EKG  . EKG 12-Lead  . EKG 12-Lead    ASSESSMENT AND PLAN:   21 year old male with past medical history significant for type 1 diabetes mellitus who is traveling from [place] visiting his friend here presents to the hospital secondary to nausea, abdominal pain and vomiting. Noted to be in DKA.  #1 DKA-with type 1 diabetes mellitus, running out of insulin. -Off insulin drip as anion gap is  closed. -Appreciate diabetes coordinator's input. Currently started on Lantus, male insulin and also sliding scale insulin. -Continue to monitor labs closely as his sugars are still elevated today. - a1c is pending  #2 hyponatremia-secondary to elevated sugars and also dehydration from nausea and vomiting. -Continue IV fluids at this time and monitor.  #3 hypokalemia-being replaced  #4 acute respiratory failure-not sure if triggered by acidosis, patient had episode of acute respiratory failure and got intubated and extubated within 2 hours. No respiratory distress symptoms at this time. Chest x-ray is completely normal at this time.  #5 DVT prophylaxis-on Lovenox.  If sugars are better controlled, can be discharged tomorrow   All the records are reviewed and case discussed with Care Management/Social Workerr. Management plans discussed with the patient, family and they are in agreement.  CODE STATUS: Full Code  TOTAL TIME TAKING CARE OF THIS PATIENT: 39 minutes.   POSSIBLE D/C IN 1-2 DAYS, DEPENDING ON CLINICAL CONDITION.   Enid Baas M.D on 08/05/2016 at 2:13 PM  Between 7am to 6pm - Pager - [phone]  After 6pm go to [email or web address] - Social research officer, government  [facility]  Office  [phone]  CC: Primary care physician; No PCP Per Patient

## 2016-08-05 NOTE — Progress Notes (Addendum)
Patient phosphorus 1.2 MD notified. MD will place orders.  Harvie Heck, RN

## 2016-08-05 NOTE — Progress Notes (Signed)
Received patient this shift intubated.pt transported  to head CT via stretcher. Once arriving back to room pt AAO. Following commands and was extubated at 2110. Pt foley discontinued at 2210.pt now off insulin drip transitioned per orders. Pt remains AAO.rm air sats wnl. No c/o pain or distress. Will cont to monitor

## 2016-08-06 LAB — GLUCOSE, CAPILLARY
GLUCOSE-CAPILLARY: 123 mg/dL — AB (ref 65–99)
Glucose-Capillary: 227 mg/dL — ABNORMAL HIGH (ref 65–99)

## 2016-08-06 LAB — HEMOGLOBIN A1C
HEMOGLOBIN A1C: 13 % — AB (ref 4.8–5.6)
MEAN PLASMA GLUCOSE: 326 mg/dL

## 2016-08-06 LAB — HIV ANTIBODY (ROUTINE TESTING W REFLEX): HIV Screen 4th Generation wRfx: NONREACTIVE

## 2016-08-06 NOTE — Discharge Instructions (Signed)
Patient advised to follow up with his Endocrinologist in [place] once he returns back in 3 weeks Continue your sugar checks as before

## 2016-08-06 NOTE — Plan of Care (Signed)
Problem: Safety: Goal: Ability to remain free from injury will improve Outcome: Adequate for Discharge Pt knows to ring for assistance when ambulating with iv pole.  Problem: Health Behavior/Discharge Planning: Goal: Ability to manage health-related needs will improve Outcome: Not Progressing Pt refusing to have am blood work this morning.  Problem: Pain Managment: Goal: General experience of comfort will improve Outcome: Adequate for Discharge No complaints of pain this shift.  Problem: Fluid Volume: Goal: Ability to maintain a balanced intake and output will improve Outcome: Progressing Still remaining on iv fluid

## 2016-08-06 NOTE — Progress Notes (Signed)
Spoke with Dr. Tobi Bastos pt is refusing morning labs. MD [place]

## 2016-08-06 NOTE — Progress Notes (Signed)
Patient is being discharged to home. DC and RX instructions given and patient acknowledged understanding. Belongings packed. Patient called friend to pick him up.

## 2016-08-06 NOTE — Discharge Summary (Signed)
[facility] - [place] at [facility]   PATIENT NAME: Isaac Dean    MR#:  [id number]  DATE OF BIRTH:  October 10, 1995  DATE OF ADMISSION:  08/04/2016 ADMITTING PHYSICIAN: Shaune Pollack, MD  DATE OF DISCHARGE: 08/06/16  PRIMARY CARE PHYSICIAN: No PCP Per Patient    ADMISSION DIAGNOSIS:  Type 1 diabetes mellitus with ketoacidosis without coma (HCC) [E10.10] DKA (diabetic ketoacidoses) (HCC) [E13.10]  DISCHARGE DIAGNOSIS:  DKA Type 1 diabetes on insulin  SECONDARY DIAGNOSIS:   Past Medical History:  Diagnosis Date  . Diabetes mellitus without complication (HCC)   . DVT (deep venous thrombosis) [facility])     HOSPITAL COURSE:   21 year old male with past medical history significant for type 1 diabetes mellitus who is traveling from [place] visiting his friend here presents to the hospital secondary to nausea, abdominal pain and vomiting. Noted to be in DKA.  #1 DKA-with type 1 diabetes mellitus, running out of insulin. -Off insulin drip as anion gap is closed. -Appreciate diabetes coordinator's input. Currently started on Lantus, male insulin and also sliding scale insulin. -Sugars much better control. Patient feels back to baseline. -Paper prescription for insulin given. Patient will follow with his endocrinologist once he returned in [place] in 3 we  #2 hyponatremia-secondary to elevated sugars and also dehydration from nausea and vomiting. -Continue IV fluids at this time and monitor. -Patient appears euvolemic. He is declined repeat labs  #3 hypokalemia-being replaced  #4 acute respiratory failure-not sure if triggered by acidosis, patient had episode of acute respiratory failure and got intubated and extubated within 2 hours. No respiratory distress symptoms at this time. Chest x-ray is completely normal at this time. Sats 100% on room air  #5 DVT prophylaxis-on Lovenox.  Patient is back to baseline. DC home. Patient agreeable and requesting  discharge this morning CONSULTS OBTAINED:    DRUG ALLERGIES:  No Known Allergies  DISCHARGE MEDICATIONS:   Current Discharge Medication List    CONTINUE these medications which have NOT CHANGED   Details  HUMALOG KWIKPEN 100 UNIT/ML KiwkPen Inject 30 Units into the skin 3 (three) times daily.    LANTUS SOLOSTAR 100 UNIT/ML Solostar Pen Inject 30 Units into the skin 2 (two) times daily.        If you experience worsening of your admission symptoms, develop shortness of breath, life threatening emergency, suicidal or homicidal thoughts you must seek medical attention immediately by calling 911 or calling your MD immediately  if symptoms less severe.  You Must read complete instructions/literature along with all the possible adverse reactions/side effects for all the Medicines you take and that have been prescribed to you. Take any new Medicines after you have completely understood and accept all the possible adverse reactions/side effects.   Please note  You were cared for by a hospitalist during your hospital stay. If you have any questions about your discharge medications or the care you received while you were in the hospital after you are discharged, you can call the unit and asked to speak with the hospitalist on call if the hospitalist that took care of you is not available. Once you are discharged, your primary care physician will handle any further medical issues. Please note that NO REFILLS for any discharge medications will be authorized once you are discharged, as it is imperative that you return to your primary care physician (or establish a relationship with a primary care physician if you do not have one) for your aftercare needs  so that they can reassess your need for medications and monitor your lab values. Today   SUBJECTIVE   Feeling fine and I would like to go home by right is going to be here soon  VITAL SIGNS:  Blood pressure 121/66, pulse 78, temperature 98.2 F  (36.8 C), temperature source Oral, resp. rate 18, height  (1.803 m), weight 69 kg (152 lb 2 oz), SpO2 100 %.  I/O:   Intake/Output Summary (Last 24 hours) at 08/06/16 0911 Last data filed at 08/05/16 1855  Gross per 24 hour  Intake             1035 ml  Output                0 ml  Net             1035 ml    PHYSICAL EXAMINATION:  GENERAL:  21 y.o.-year-old patient lying in the bed with no acute distress.  EYES: Pupils equal, round, reactive to light and accommodation. No scleral icterus. Extraocular muscles intact.  HEENT: Head atraumatic, normocephalic. Oropharynx and nasopharynx clear.  NECK:  Supple, no jugular venous distention. No thyroid enlargement, no tenderness.  LUNGS: Normal breath sounds bilaterally, no wheezing, rales,rhonchi or crepitation. No use of accessory muscles of respiration.  CARDIOVASCULAR: S1, S2 normal. No murmurs, rubs, or gallops.  ABDOMEN: Soft, non-tender, non-distended. Bowel sounds present. No organomegaly or mass.  EXTREMITIES: No pedal edema, cyanosis, or clubbing.  NEUROLOGIC: Cranial nerves II through XII are intact. Muscle strength 5/5 in all extremities. Sensation intact. Gait not checked.  PSYCHIATRIC: The patient is alert and oriented x 3.  SKIN: No obvious rash, lesion, or ulcer.   DATA REVIEW:   CBC   Recent Labs Lab 08/05/16 0352  WBC 8.0  HGB 13.9  HCT 39.9*  PLT 299    Chemistries   Recent Labs Lab 08/05/16 0754 08/05/16 1649  NA 129*  --   K 3.6  --   CL 99*  --   CO2 21*  --   GLUCOSE 340*  --   BUN 10  --   CREATININE 0.82  --   CALCIUM 8.3*  --   MG  --  1.8    Microbiology Results   Recent Results (from the past 240 hour(s))  MRSA PCR Screening     Status: None   Collection Time: 08/04/16  6:16 PM  Result Value Ref Range Status   MRSA by PCR NEGATIVE NEGATIVE Final    Comment:        The GeneXpert MRSA Assay (FDA approved for NASAL specimens only), is one component of a comprehensive MRSA  colonization surveillance program. It is not intended to diagnose MRSA infection nor to guide or monitor treatment for MRSA infections.     RADIOLOGY:  Dg Chest 2 View  Result Date: 08/04/2016 CLINICAL DATA:  Malaise, vomiting, for the odor to the breath, diabetic. EXAM: CHEST  2 VIEW COMPARISON:  None in PACs FINDINGS: The lungs are adequately inflated and clear. The heart and pulmonary vascularity are normal. The mediastinum is normal in width. There is no pleural effusion. IMPRESSION: There is no pneumonia nor other acute cardiopulmonary abnormality. Electronically Signed   By: David  Swaziland M.D.   On: 08/04/2016 15:51   Ct Head Wo Contrast  Result Date: 08/04/2016 CLINICAL DATA:  Dizziness and weakness.  Altered mentation. EXAM: CT HEAD WITHOUT CONTRAST TECHNIQUE: Contiguous axial images were obtained from the base of the skull  through the vertex without intravenous contrast. COMPARISON:  None. FINDINGS: BRAIN: The ventricles and sulci are normal. No intraparenchymal hemorrhage, mass effect nor midline shift. No acute large vascular territory infarcts. Grey-white matter distinction is maintained. The basal ganglia are unremarkable. No abnormal extra-axial fluid collections. Basal cisterns are not effaced and midline. The brainstem and cerebellar hemispheres are without acute abnormalities. VASCULAR: Unremarkable. SKULL/SOFT TISSUES: No skull fracture. No significant soft tissue swelling. ORBITS/SINUSES: The included ocular globes and orbital contents are normal.The mastoid air cells are clear. The included paranasal sinuses are well-aerated. OTHER: None. IMPRESSION: No acute intracranial abnormality. Electronically Signed   By: Tollie Eth M.D.   On: 08/04/2016 20:42   Dg Chest Port 1 View  Result Date: 08/04/2016 CLINICAL DATA:  Ventilator dependence. EXAM: PORTABLE CHEST 1 VIEW COMPARISON:  08/04/2016, earlier the same day. FINDINGS: 1902 hours. Endotracheal tube tip is 4.7 cm above the  base the carina. Lungs are clear bilaterally. The cardiopericardial silhouette is within normal limits for size. The visualized bony structures of the thorax are intact. Telemetry leads overlie the chest. IMPRESSION: Endotracheal tube tip 4.7 cm above the base of the carina. No acute cardiopulmonary findings. Electronically Signed   By: Kennith Center M.D.   On: 08/04/2016 19:32     Management plans discussed with the patient, family and they are in agreement.  CODE STATUS:     Code Status Orders        Start     Ordered   08/04/16 1820  Full code  Continuous     08/04/16 1819    Code Status History    Date Active Date Inactive Code Status Order ID Comments User Context   This patient has a current code status but no historical code status.      TOTAL TIME TAKING CARE OF THIS PATIENT: *40 minutes.    Steve Youngberg M.D on 08/06/2016 at 9:11 AM  Between 7am to 6pm - Pager - [phone] After 6pm go to [email or web address] - Social research officer, government  [facility] [place] [facility]  Office  [phone]  CC: Primary care physician; No PCP Per Patient

## 2016-08-06 NOTE — Progress Notes (Signed)
Pt Refusing to have his labs drawn this morning. Stated to lab that he will leave against medical advise if he has to have his labs drawn this morning.

## 2016-08-10 LAB — BLOOD GAS, VENOUS
Patient temperature: 37
pCO2, Ven: 33 mmHg — ABNORMAL LOW (ref 44.0–60.0)
pH, Ven: 7.28 (ref 7.250–7.430)

## 2018-05-26 IMAGING — CT CT HEAD W/O CM
3 series · 15 of 47 positions shown, 18 images · non-contrast
Comparison: None.

CLINICAL DATA: Dizziness and weakness.  Altered mentation.

EXAM:
CT HEAD WITHOUT CONTRAST
TECHNIQUE: Contiguous axial images were obtained from the base of the skull
through the vertex without intravenous contrast.

[Series 2: head wo · axial · 0.42mm/px · z∈[+250,+375]mm · 9 of 31 slices shown, 12 images]
[im 3/31  brain]
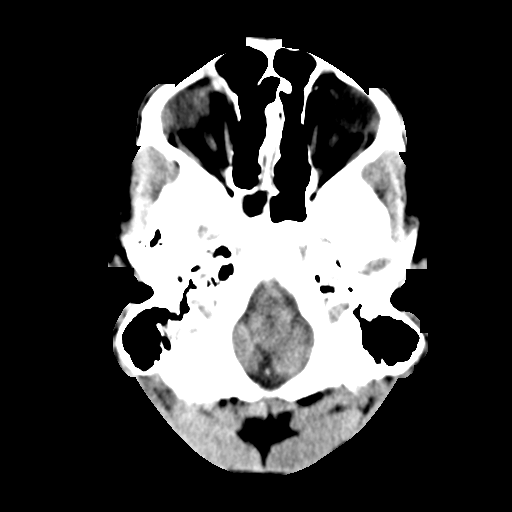
[im 3/31  bone]
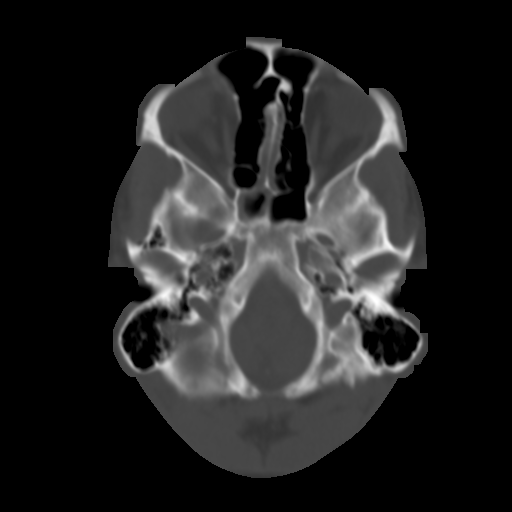
[im 6/31  brain]
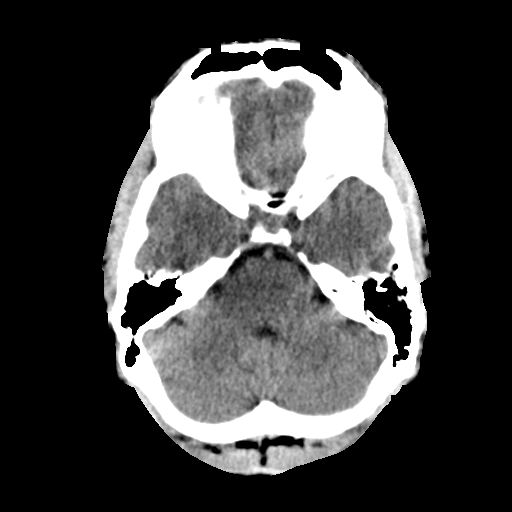
[im 9/31  brain]
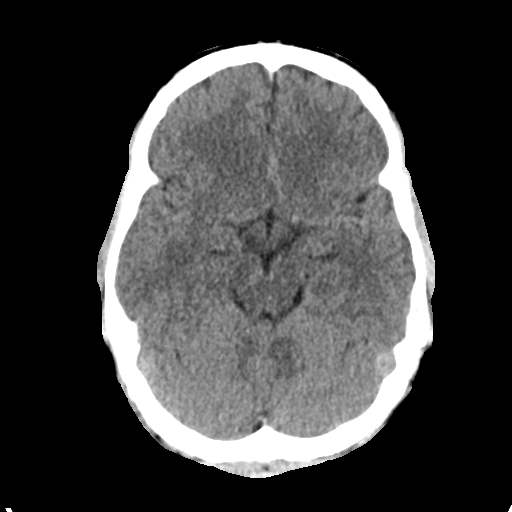
[im 12/31  brain]
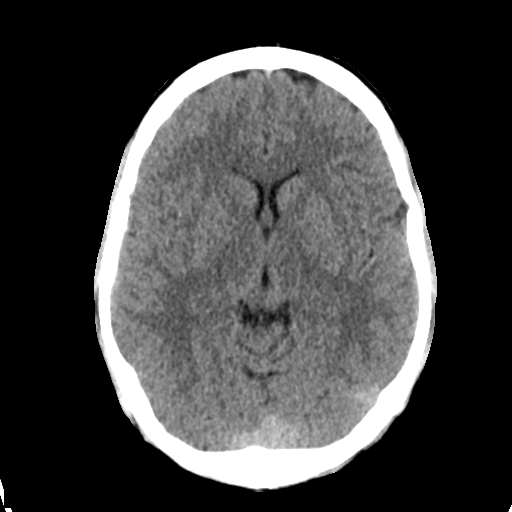
[im 16/31  brain]
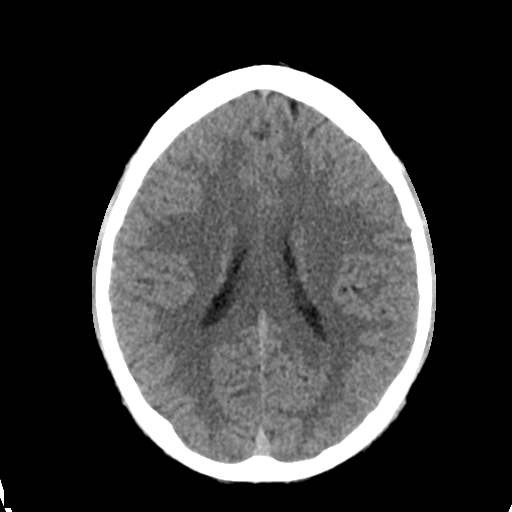
[im 16/31  bone]
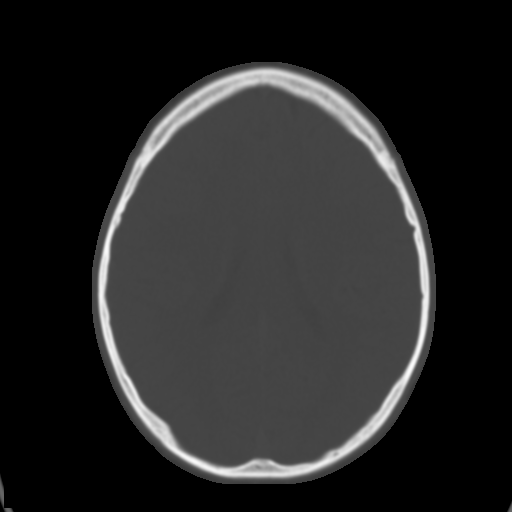
[im 19/31  brain]
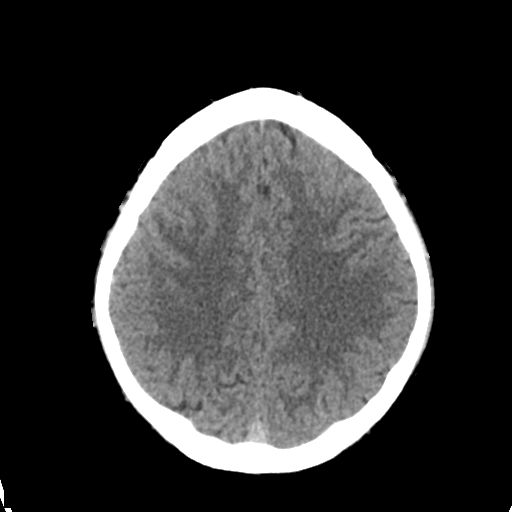
[im 22/31  brain]
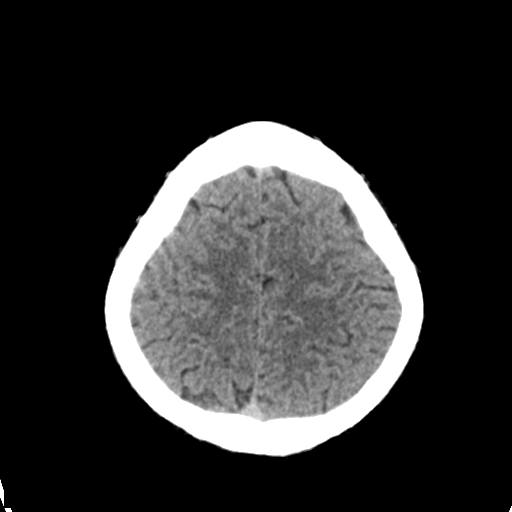
[im 25/31  brain]
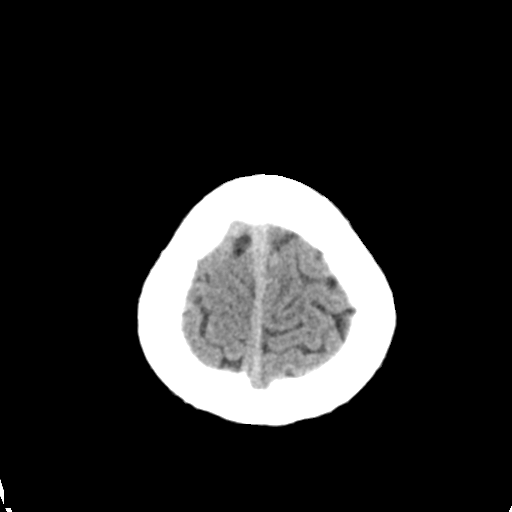
[im 28/31  brain]
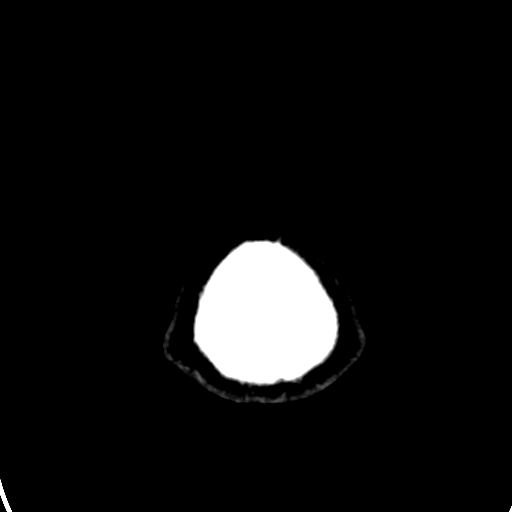
[im 28/31  bone]
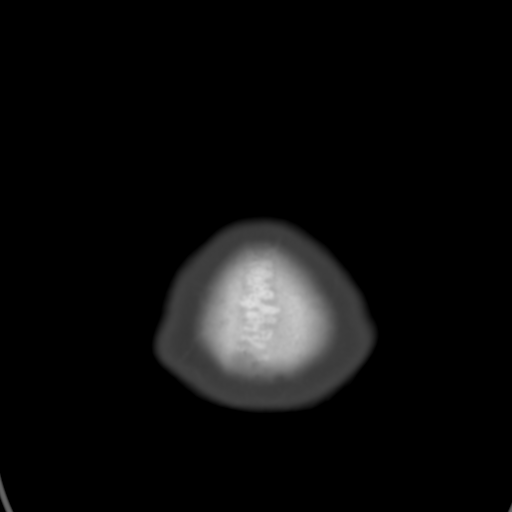

[Series 4: coronal soft tissue · coronal · 0.32mm/px · 3 of 61 slices shown]
[im 21/61  brain]
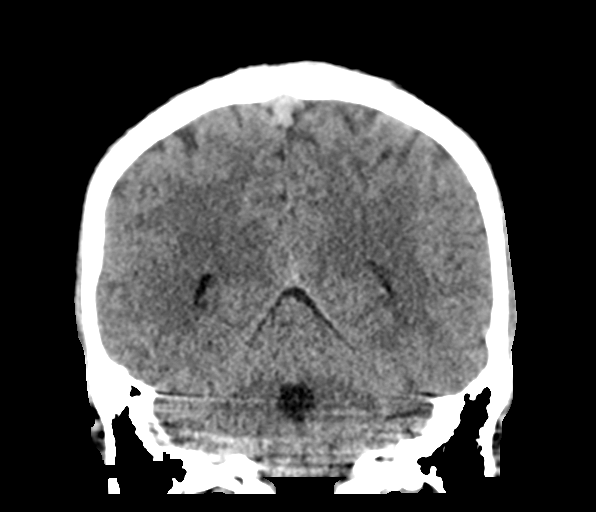
[im 27/61  brain]
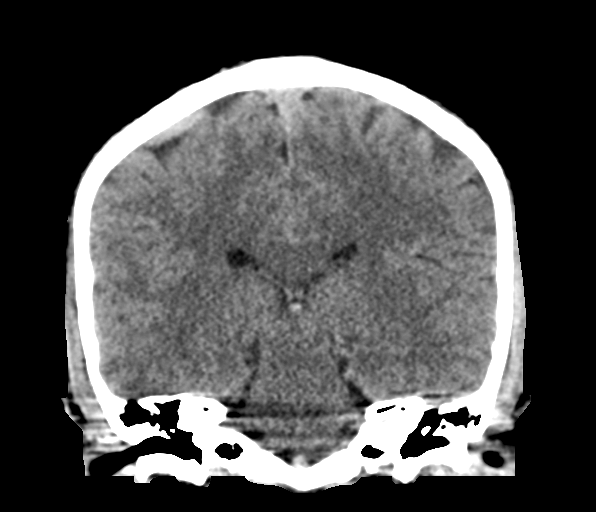
[im 34/61  brain]
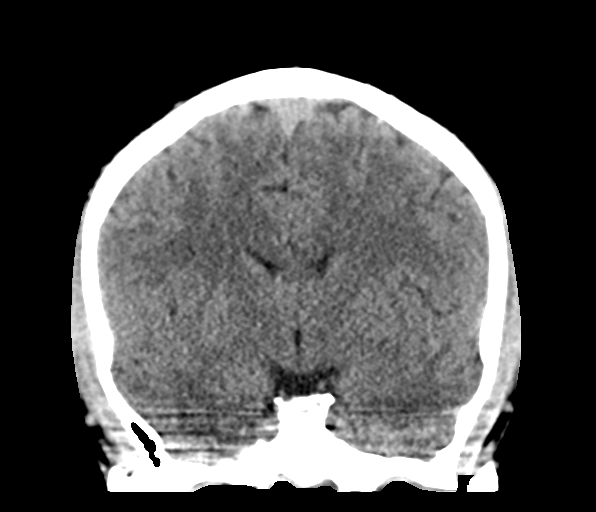

[Series 5: sagittal soft tissue · sagittal · 0.31mm/px · 3 of 52 slices shown]
[im 18/52  brain]
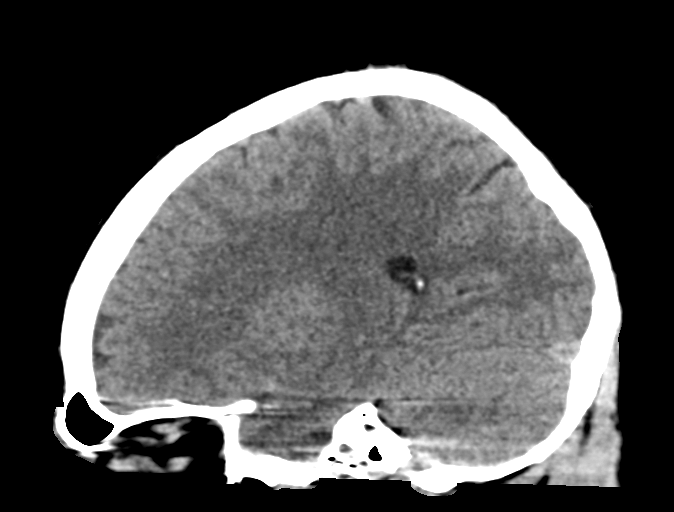
[im 26/52  brain]
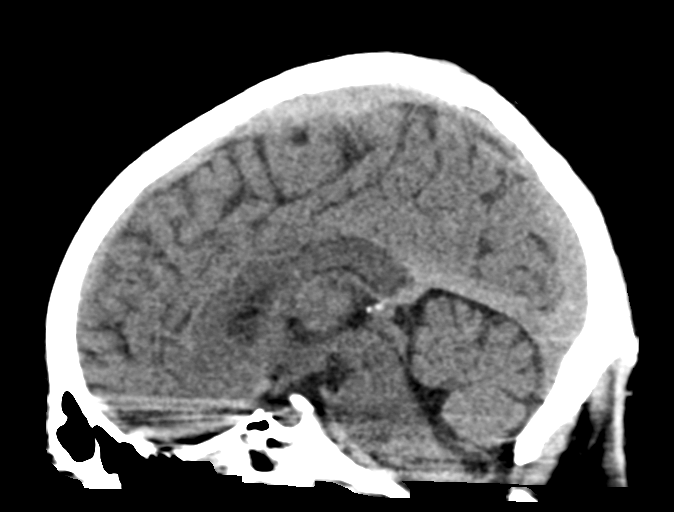
[im 35/52  brain]
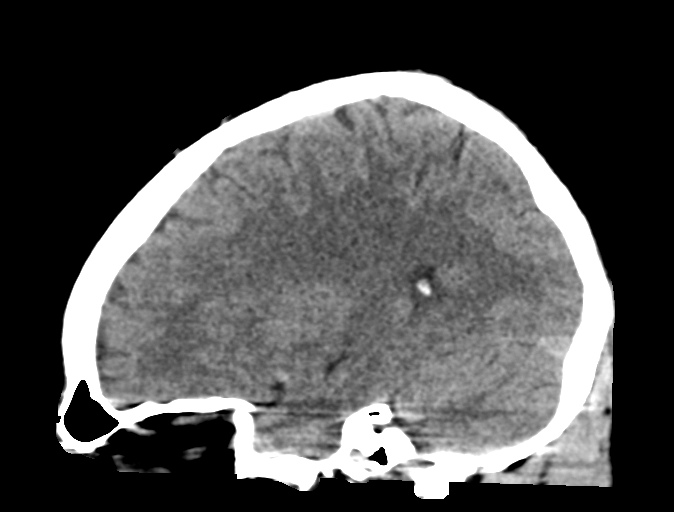

[15 of 47 positions shown; findings below may reference images not displayed]

FINDINGS: BRAIN: The ventricles and sulci are normal. No intraparenchymal
hemorrhage, mass effect nor midline shift. No acute large vascular
territory infarcts. Grey-white matter distinction is maintained. The
basal ganglia are unremarkable. No abnormal extra-axial fluid
collections. Basal cisterns are not effaced and midline. The
brainstem and cerebellar hemispheres are without acute
abnormalities.

VASCULAR: Unremarkable.

SKULL/SOFT TISSUES: No skull fracture. No significant soft tissue
swelling.

ORBITS/SINUSES: The included ocular globes and orbital contents are
normal.The mastoid air cells are clear. The included paranasal
sinuses are well-aerated.

OTHER: None.
IMPRESSION: No acute intracranial abnormality.

## 2018-05-26 IMAGING — DX DG CHEST 1V PORT
1 series · 1 of 1 positions shown · non-contrast
Comparison: 08/04/2016, earlier the same day.

CLINICAL DATA: Ventilator dependence.

EXAM:
PORTABLE CHEST 1 VIEW

[chest ap]
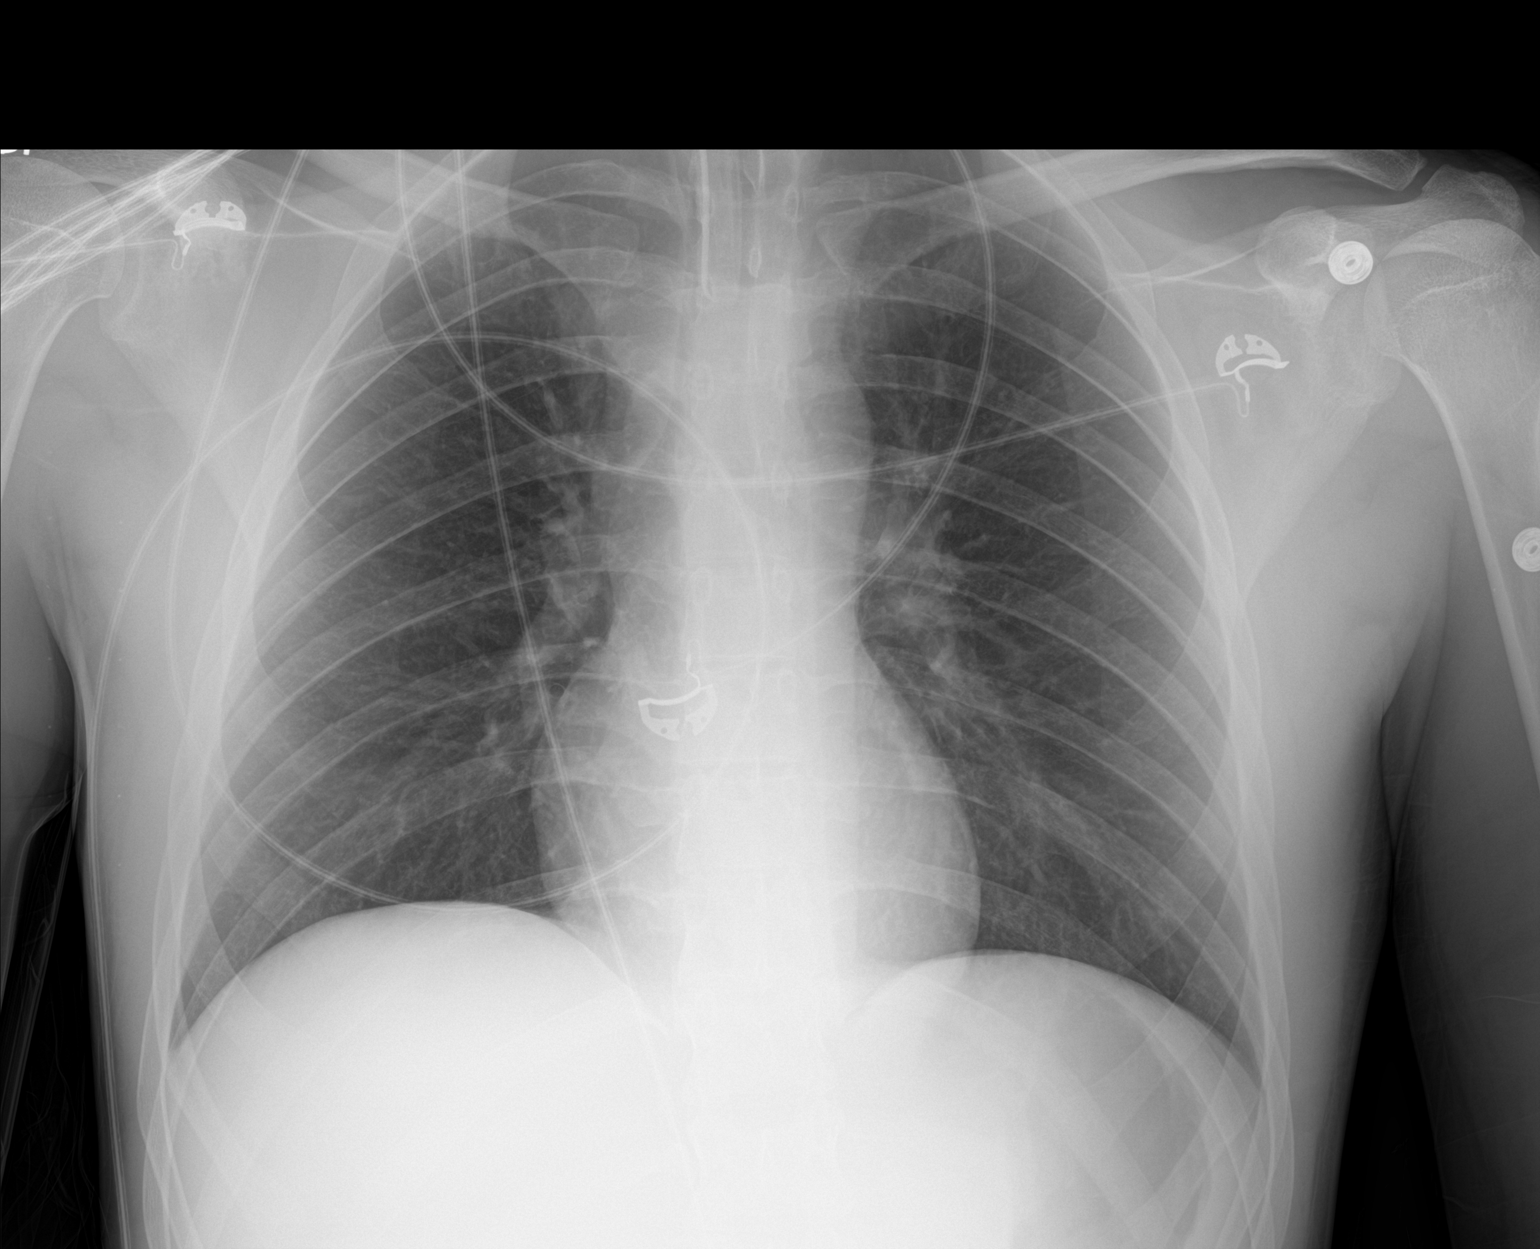

[1 of 1 positions shown; findings below may reference images not displayed]

FINDINGS: 5240 hours. Endotracheal tube tip is 4.7 cm above the base the
carina. Lungs are clear bilaterally. The cardiopericardial
silhouette is within normal limits for size. The visualized bony
structures of the thorax are intact. Telemetry leads overlie the
chest.
IMPRESSION: Endotracheal tube tip 4.7 cm above the base of the carina.

No acute cardiopulmonary findings.
